# Patient Record
Sex: Female | Born: 1935 | Race: Black or African American | Hispanic: No | Smoking: Former smoker
Health system: Southern US, Community
[De-identification: ages and names within clinical notes are randomized; demographics above are authoritative.]

## PROBLEM LIST (undated history)

## (undated) DIAGNOSIS — E785 Hyperlipidemia, unspecified: Secondary | ICD-10-CM

## (undated) DIAGNOSIS — I10 Essential (primary) hypertension: Secondary | ICD-10-CM

## (undated) DIAGNOSIS — N189 Chronic kidney disease, unspecified: Secondary | ICD-10-CM

## (undated) DIAGNOSIS — C50919 Malignant neoplasm of unspecified site of unspecified female breast: Secondary | ICD-10-CM

## (undated) HISTORY — PX: JOINT REPLACEMENT: SHX530

---

## 2017-03-21 ENCOUNTER — Observation Stay (HOSPITAL_BASED_OUTPATIENT_CLINIC_OR_DEPARTMENT_OTHER)
Admission: EM | Admit: 2017-03-21 | Discharge: 2017-03-22 | Disposition: A | Discharge status: Home / Self Care | Payer: Medicare Other | Attending: Family Medicine | Admitting: Family Medicine

## 2017-03-21 ENCOUNTER — Encounter (HOSPITAL_BASED_OUTPATIENT_CLINIC_OR_DEPARTMENT_OTHER): Payer: Self-pay | Admitting: *Deleted

## 2017-03-21 ENCOUNTER — Emergency Department (HOSPITAL_BASED_OUTPATIENT_CLINIC_OR_DEPARTMENT_OTHER): Payer: Medicare Other

## 2017-03-21 DIAGNOSIS — R079 Chest pain, unspecified: Secondary | ICD-10-CM | POA: Diagnosis not present

## 2017-03-21 DIAGNOSIS — Z853 Personal history of malignant neoplasm of breast: Secondary | ICD-10-CM | POA: Diagnosis not present

## 2017-03-21 DIAGNOSIS — Z79899 Other long term (current) drug therapy: Secondary | ICD-10-CM | POA: Diagnosis not present

## 2017-03-21 DIAGNOSIS — R0789 Other chest pain: Secondary | ICD-10-CM | POA: Diagnosis present

## 2017-03-21 DIAGNOSIS — I10 Essential (primary) hypertension: Secondary | ICD-10-CM | POA: Diagnosis not present

## 2017-03-21 DIAGNOSIS — E785 Hyperlipidemia, unspecified: Secondary | ICD-10-CM

## 2017-03-21 DIAGNOSIS — N189 Chronic kidney disease, unspecified: Secondary | ICD-10-CM | POA: Diagnosis not present

## 2017-03-21 DIAGNOSIS — R911 Solitary pulmonary nodule: Secondary | ICD-10-CM

## 2017-03-21 DIAGNOSIS — R072 Precordial pain: Secondary | ICD-10-CM | POA: Diagnosis not present

## 2017-03-21 DIAGNOSIS — I129 Hypertensive chronic kidney disease with stage 1 through stage 4 chronic kidney disease, or unspecified chronic kidney disease: Secondary | ICD-10-CM | POA: Insufficient documentation

## 2017-03-21 DIAGNOSIS — Z87891 Personal history of nicotine dependence: Secondary | ICD-10-CM | POA: Diagnosis not present

## 2017-03-21 HISTORY — DX: Chronic kidney disease, unspecified: N18.9

## 2017-03-21 HISTORY — DX: Malignant neoplasm of unspecified site of unspecified female breast: C50.919

## 2017-03-21 HISTORY — DX: Essential (primary) hypertension: I10

## 2017-03-21 HISTORY — DX: Hyperlipidemia, unspecified: E78.5

## 2017-03-21 LAB — CBC
HCT: 34.5 % — ABNORMAL LOW (ref 36.0–46.0)
Hemoglobin: 11.5 g/dL — ABNORMAL LOW (ref 12.0–15.0)
MCH: 31.9 pg (ref 26.0–34.0)
MCHC: 33.3 g/dL (ref 30.0–36.0)
MCV: 95.6 fL (ref 78.0–100.0)
PLATELETS: 125 10*3/uL — AB (ref 150–400)
RBC: 3.61 MIL/uL — AB (ref 3.87–5.11)
RDW: 13.4 % (ref 11.5–15.5)
WBC: 7.8 10*3/uL (ref 4.0–10.5)

## 2017-03-21 LAB — BASIC METABOLIC PANEL
Anion gap: 8 (ref 5–15)
BUN: 37 mg/dL — AB (ref 6–20)
CALCIUM: 9.8 mg/dL (ref 8.9–10.3)
CO2: 23 mmol/L (ref 22–32)
CREATININE: 2.67 mg/dL — AB (ref 0.44–1.00)
Chloride: 104 mmol/L (ref 101–111)
GFR calc Af Amer: 18 mL/min — ABNORMAL LOW (ref >60)
GFR, EST NON AFRICAN AMERICAN: 15 mL/min — AB (ref >60)
GLUCOSE: 123 mg/dL — AB (ref 65–99)
POTASSIUM: 4.7 mmol/L (ref 3.5–5.1)
SODIUM: 135 mmol/L (ref 135–145)

## 2017-03-21 LAB — TROPONIN I
TROPONIN I: 0.03 ng/mL — AB (ref <0.03)
TROPONIN I: 0.03 ng/mL — AB (ref <0.03)

## 2017-03-21 MED ORDER — SIMVASTATIN 40 MG PO TABS
40.0000 mg | ORAL_TABLET | Freq: Every day | ORAL | Status: DC
  Administered 2017-03-21: 40 mg via ORAL
  Filled 2017-03-21: qty 1

## 2017-03-21 MED ORDER — ALLOPURINOL 300 MG PO TABS
150.0000 mg | ORAL_TABLET | Freq: Every day | ORAL | Status: DC
  Administered 2017-03-22: 150 mg via ORAL
  Filled 2017-03-21: qty 1

## 2017-03-21 MED ORDER — FELODIPINE ER 10 MG PO TB24
10.0000 mg | ORAL_TABLET | Freq: Every day | ORAL | Status: DC
  Administered 2017-03-22: 10 mg via ORAL
  Filled 2017-03-21: qty 1

## 2017-03-21 MED ORDER — ACETAMINOPHEN 325 MG PO TABS: 650.0000 mg | ORAL_TABLET | ORAL | Status: DC | PRN

## 2017-03-21 MED ORDER — CALCITRIOL 0.25 MCG PO CAPS: 0.2500 ug | ORAL_CAPSULE | Freq: Every day | ORAL | Status: DC

## 2017-03-21 MED ORDER — ONDANSETRON HCL 4 MG/2ML IJ SOLN: 4.0000 mg | Freq: Four times a day (QID) | INTRAMUSCULAR | Status: DC | PRN

## 2017-03-21 MED ORDER — ASPIRIN EC 81 MG PO TBEC
81.0000 mg | DELAYED_RELEASE_TABLET | Freq: Every day | ORAL | Status: DC
  Administered 2017-03-22: 81 mg via ORAL
  Filled 2017-03-21: qty 1

## 2017-03-21 MED ORDER — TRAMADOL HCL 50 MG PO TABS
50.0000 mg | ORAL_TABLET | Freq: Two times a day (BID) | ORAL | Status: DC | PRN
  Filled 2017-03-21: qty 1

## 2017-03-21 MED ORDER — ASPIRIN 81 MG PO CHEW
324.0000 mg | CHEWABLE_TABLET | Freq: Once | ORAL | Status: AC
  Administered 2017-03-21: 324 mg via ORAL
  Filled 2017-03-21: qty 4

## 2017-03-21 MED ORDER — CALCITRIOL 0.25 MCG PO CAPS
0.2500 ug | ORAL_CAPSULE | ORAL | Status: DC
  Administered 2017-03-21: 0.25 ug via ORAL
  Filled 2017-03-21 (×2): qty 1

## 2017-03-21 MED ORDER — TAMOXIFEN CITRATE 10 MG PO TABS
20.0000 mg | ORAL_TABLET | Freq: Every day | ORAL | Status: DC
  Administered 2017-03-22: 20 mg via ORAL
  Filled 2017-03-21: qty 2

## 2017-03-21 NOTE — ED Notes (Signed)
Report to Rey, RN on 3W.

## 2017-03-21 NOTE — ED Triage Notes (Signed)
Pt /o left sided chest pain x 3 days " on and off" denies Sob

## 2017-03-21 NOTE — H&P (Signed)
History and Physical    Joan Lawson VOZ:[id number] DOB: 11-08-1928 DOA: 03/21/2017  PCP: Providers, including PCP and nephrologist, are in [place], [place].  Patient coming from: [facility]  Chief Complaint: Chest pain  HPI: Joan Lawson is a 83 y.o. woman with a history of HTN, HLD, CKD (stage unknown, baseline creatinine unknown but she is followed by a nephrologist in [place]), and breast cancer (localized to left breast per patient; she has undergone radiation only; she is on tamoxifen) who presented to the ED in [facility] for evaluation of chest pain.  She has had chest pain described as a dull ache or mild discomfort intermittently across her anterior chest for several days.  She tells me that it has been both right and left sided.  Pain will last up to one hour at a time; not affected by acetaminophen or tramadol.  She denies any associated symptoms including shortness of breath, light-headedness, dizziness, diaphoresis, nausea, or vomiting.  No recent unexplained swelling.  No recent trauma or falls.  She says that she has had similar pains in the past, which she attributed to gas.  She does not have a known history of CAD.  Her last stress test was in 2015, and was obtained as a part of her pre-operative assessment before undergoing right knee replacement.  She also reports that she had a "normal" EKG three month ago.  She does not think that she has ever had an echocardiogram.  ED Course: EKG does not show acute ST segment changes.  First troponin 0.03.  BUN 37, creatinine 2.67.  The rest of her BMP is unremarkable.  Normal WBC count.  Hgb 11.5.  Platelet count 125.  Chest xray shows probable chronic changes at the left lung base, two possible pulmonary nodules on the left, and probable chronic changes in multiple "mid vertebral bodies".  She received Aspirin 324mg  in the ED.  Currently, she is chest pain free.  Hospitalist asked to place in observation.  Review of Systems: As per HPI  otherwise 10 systems reviewed and negative.  Past Medical History:  Diagnosis Date  . Breast cancer ([place])   . Chronic kidney disease   . Hyperlipemia   . Hypertension   Chronic low back pain--uses tramadol PRN  Past Surgical History:  Procedure Laterality Date  . JOINT REPLACEMENT Right    KNEE     reports that she has quit smoking. She has never used smokeless tobacco. She reports that she does not drink alcohol or use drugs.  She is a widow; her husband died in December 08, 2016.  She is still grieving.  She has been living between her son and daughter's homes since her husband passed.  She is currently with her daughter in [place].  Allergies  Allergen Reactions  . Sulfa Antibiotics     Family History  Problem Relation Age of Onset  . Heart disease Neg Hx      Prior to Admission medications   Medication Sig Start Date End Date Taking? Authorizing Provider  allopurinol (ZYLOPRIM) 100 MG tablet Take 150 mg by mouth daily.   Yes [provider]  aspirin EC 81 MG tablet Take 81 mg by mouth daily.   Yes [provider]  calcitRIOL (ROCALTROL) 0.25 MCG capsule Take 0.25 mcg by mouth daily.   Yes [provider]  felodipine (PLENDIL) 10 MG 24 hr tablet Take 10 mg by mouth daily.   Yes [provider]  fluticasone (FLONASE) 50 MCG/ACT nasal spray Place  2 sprays into both nostrils daily.   Yes [provider]  furosemide (LASIX) 20 MG tablet Take 20 mg by mouth.   Yes [provider]  simvastatin (ZOCOR) 40 MG tablet Take 40 mg by mouth daily.   Yes [provider]  tamoxifen (NOLVADEX) 20 MG tablet Take 20 mg by mouth daily.   Yes [provider]  traMADol (ULTRAM) 50 MG tablet Take by mouth every 6 (six) hours as needed.   Yes [provider]    Physical Exam: Vitals:   03/21/17 1435 03/21/17 1510 03/21/17 1600 03/21/17 1921  BP: (!) 139/56  (!) 122/56 128/62  Pulse: (!) 120 100 92 93  Resp: 20  16 (!) 21 18  Temp: 99 F (37.2 C)   99.5 F (37.5 C)  TempSrc: Oral   Oral  SpO2: 95% 95% 94% 96%  Weight:      Height:          Constitutional: NAD, calm, comfortable, NONtoxic appearing Vitals:   03/21/17 1435 03/21/17 1510 03/21/17 1600 03/21/17 1921  BP: (!) 139/56  (!) 122/56 128/62  Pulse: (!) 120 100 92 93  Resp: 20 16 (!) 21 18  Temp: 99 F (37.2 C)   99.5 F (37.5 C)  TempSrc: Oral   Oral  SpO2: 95% 95% 94% 96%  Weight:      Height:       Eyes: PERRL, lids and conjunctivae normal ENMT: Mucous membranes are moist. Posterior pharynx clear of any exudate or lesions. Appears to be wearing dentures. Neck: normal appearance, supple, no masses Respiratory: clear to auscultation bilaterally, no wheezing, no crackles. Normal respiratory effort. No accessory muscle use.  Cardiovascular: Normal rate, regular rhythm, no murmurs / rubs / gallops. No extremity edema. 2+ pedal pulses. No carotid bruits.  GI: abdomen is soft and compressible.  No distention.  No tenderness.  Bowel sounds are present. Musculoskeletal:  No reproducible chest wall tenderness.  No joint deformity in upper and lower extremities. Good ROM, no contractures. Normal muscle tone.  Skin: no rashes, warm and dry Neurologic: CN 2-12 grossly intact. Sensation intact, Strength symmetric bilaterally. Psychiatric: Normal judgment and insight. Alert and oriented x 3. Normal mood.     Labs on Admission: I have personally reviewed following labs and imaging studies  CBC:  Recent Labs Lab 03/21/17 1526  WBC 7.8  HGB 11.5*  HCT 34.5*  MCV 95.6  PLT 741*   Basic Metabolic Panel:  Recent Labs Lab 03/21/17 1526  NA 135  K 4.7  CL 104  CO2 23  GLUCOSE 123*  BUN 37*  CREATININE 2.67*  CALCIUM 9.8   GFR: Estimated Creatinine Clearance: 13.8 mL/min (A) (by C-G formula based on SCr of 2.67 mg/dL (H)).  Cardiac Enzymes:  Recent Labs Lab 03/21/17 1526  TROPONINI 0.03*    Radiological Exams on  Admission: Dg Chest 2 View  Result Date: 03/21/2017 CLINICAL DATA:  Left-sided chest pain 3 days intermittently. History of breast cancer. EXAM: CHEST  2 VIEW COMPARISON:  None. FINDINGS: Lungs are adequately inflated and demonstrate subtle linear density left base likely atelectasis with minimal blunting of the left costophrenic angle which may be due to a small amount of pleural fluid versus pleuroparenchymal scarring. Possible small nodular foci over the left mid and lower lung. Mild linear density over the upper lungs on the lateral film suggesting atelectasis. Cardiomediastinal silhouette is within normal. There is minimal calcified plaque over the aortic arch. There are degenerative changes  of the spine. Mild anterior wedging of 3 adjacent mid vertebral bodies with associated moderate intervening disc space narrowing. IMPRESSION: Subtle changes in the left base likely chronic due to linear atelectasis with pleuroparenchymal scarring versus tiny amount of pleural fluid. Two possible small nodular densities over the left mid and lower lung recommend noncontrast chest CT to exclude metastatic disease in this patient with known history of breast cancer. Subtle anterior wedging of 3 adjacent mid vertebral bodies, age indeterminate but likely chronic with moderate associated disc space narrowing at the intervening disc spaces. Electronically Signed   By: Marin Olp M.D.   On: 03/21/2017 16:26    EKG: Independently reviewed. Sinus tachycardia.  Normal intervals.  No acute ST segment changes.  Assessment/Plan Principal Problem:   Exertional chest pain Active Problems:   CKD (chronic kidney disease)   HTN (hypertension)   HLD (hyperlipidemia)   History of breast cancer   Pulmonary nodule     Atypical chest pain in patient with cardiac risk factors. --telemetry monitoring --Serial troponin --Complete echo in the AM --NPO tomorrow morning in the event that she is referred for stress  test --Continue baby aspirin, statin for now  CKD, baseline renal function unknown at this time --Hold lasix for now --Patient reports that a family member will be obtaining some of her outside medical records for review. --continue calcitriol  HTN --Felodipine  HLD --Statin  Probable gout history --Allopurinol  History of breast cancer --Tamoxifen  Possible pulmonary nodules --Will need CT of her chest for follow-up at some point  Chronic low back pain --Tramadol prn   DVT prophylaxis: Low risk, outpatient status Code Status: FULL Family Communication: Daughter present at bedside at time of admission. Disposition Plan: Expect she will go home with her daughter when ready for discharge. Consults called: NONE Admission status: Place in observation with telemetry monitoring.   TIME SPENT: 60 minutes   Eber Jones MD [facility] Pager [phone]  If 7PM-7AM, please contact night-coverage [email or web address] Password [email or web address]  03/21/2017, 8:22 PM

## 2017-03-21 NOTE — ED Notes (Signed)
Patient transported to X-ray 

## 2017-03-21 NOTE — ED Notes (Addendum)
Attempted to call report to 3W; nurse unavailable; this RN contact info provided.  

## 2017-03-21 NOTE — Progress Notes (Signed)
   Patient coming from [facility] for ACS rule out due to complaints of exertional chest pain. HEART score 4-5. Currently pain free. Received ASA and Nitro x1 in ED. Initial trop 0.03 and EKG w/o ACS. No previous cardiac workup. Visiting from [place], [place]. Pt accepted to [facility] service at [facility] for Tele Obs admission.   Linna Darner, MD [facility] 03/21/2017, 5:14 PM

## 2017-03-21 NOTE — Progress Notes (Addendum)
Dr. Eulas Post aware of pt's arrival and will be up shortly to do pt's admission. Pt's VSS, denies any complaints of discomfort and will continue to monitor.

## 2017-03-21 NOTE — ED Provider Notes (Signed)
[facility] DEPT [facility] Provider Note   CSN: [id number] Arrival date & time: 03/21/17  1428     History   Chief Complaint Chief Complaint  Patient presents with  . Chest Pain    HPI Joan Lawson is a 83 y.o. female.  83 year old female with PMH of HTN, HLD, breast cancer in remission, and CKD presents with chest pain. Chest pain varies in location between midline and left sided chest pain. Chest pain started approximately 3 days ago and is not constant. Occurs with exertion and movement. Describes the pain as a dull sensation. Never radiates to the right side of chest, to the arms, or to the jaw. Pertinent negatives include lack of SOB, cough, hemoptysis, calf pain, LE edema, and heart palpitations. Denies PMH of MI or CVA.      Past Medical History:  Diagnosis Date  . Breast cancer ([place])   . Hyperlipemia   . Hypertension     There are no active problems to display for this patient.   Past Surgical History:  Procedure Laterality Date  . JOINT REPLACEMENT      OB History    No data available       Home Medications    Prior to Admission medications   Medication Sig Start Date End Date Taking? Authorizing Provider  allopurinol (ZYLOPRIM) 100 MG tablet Take 150 mg by mouth daily.   Yes [provider]  aspirin EC 81 MG tablet Take 81 mg by mouth daily.   Yes [provider]  calcitRIOL (ROCALTROL) 0.25 MCG capsule Take 0.25 mcg by mouth daily.   Yes [provider]  felodipine (PLENDIL) 10 MG 24 hr tablet Take 10 mg by mouth daily.   Yes [provider]  fluticasone (FLONASE) 50 MCG/ACT nasal spray Place 2 sprays into both nostrils daily.   Yes [provider]  furosemide (LASIX) 20 MG tablet Take 20 mg by mouth.   Yes [provider]  simvastatin (ZOCOR) 40 MG tablet Take 40 mg by mouth daily.   Yes [provider]  tamoxifen (NOLVADEX) 20 MG tablet Take 20 mg by mouth daily.   Yes [provider]  traMADol (ULTRAM) 50 MG tablet Take by mouth every 6 (six) hours as needed.   Yes [provider]    Family History No family history on file.  Social History Social History  Substance Use Topics  . Smoking status: Former Research scientist (life sciences)  . Smokeless tobacco: Not on file  . Alcohol use No     Allergies   Sulfa antibiotics   Review of Systems Review of Systems  Constitutional: Negative for chills and fever.  HENT: Negative for congestion.   Respiratory: Negative for cough, chest tightness, shortness of breath and wheezing.   Cardiovascular: Positive for chest pain. Negative for palpitations and leg swelling.  Gastrointestinal: Negative for abdominal pain.  Musculoskeletal: Negative for arthralgias and myalgias.  Neurological: Negative for dizziness, weakness and light-headedness.     Physical Exam Updated Vital Signs BP (!) 122/56   Pulse 92   Temp 99 F (37.2 C) (Oral)   Resp (!) 21   Ht 5\' 3"  (1.6 m)   Wt 63.5 kg (140 lb)   SpO2 94%   BMI 24.80 kg/m   Physical Exam  Constitutional: She is oriented to person, place, and time.  Thin elderly woman in NAD, non-toxic appearing   HENT:  Head: Normocephalic and atraumatic.  Eyes: EOM are normal. Pupils are equal, round,  and reactive to light.  Neck: Normal range of motion. Neck supple.  Cardiovascular:  No murmur heard. Tachycardic regular rhythm   Pulmonary/Chest: Effort normal and breath sounds normal. No respiratory distress. She has no wheezes.  Abdominal: Soft. Bowel sounds are normal. She exhibits no distension. There is no tenderness.  Musculoskeletal: She exhibits no edema.  No TTP of calves bilaterally   Neurological: She is alert and oriented to person, place, and time.  Skin: Skin is warm and dry.  Psychiatric: She has a normal mood and affect. Her behavior is normal.     ED Treatments / Results  Labs (all labs ordered are listed, but only abnormal results are displayed) Labs  Reviewed  CBC - Abnormal; Notable for the following:       Result Value   RBC 3.61 (*)    Hemoglobin 11.5 (*)    HCT 34.5 (*)    Platelets 125 (*)    All other components within normal limits  BASIC METABOLIC PANEL - Abnormal; Notable for the following:    Glucose, Bld 123 (*)    BUN 37 (*)    Creatinine, Ser 2.67 (*)    GFR calc non Af Amer 15 (*)    GFR calc Af Amer 18 (*)    All other components within normal limits  TROPONIN I - Abnormal; Notable for the following:    Troponin I 0.03 (*)    All other components within normal limits    EKG  EKG Interpretation  Date/Time:  Monday March 21 2017 14:46:48 EDT Ventricular Rate:  111 PR Interval:    QRS Duration: 82 QT Interval:  347 QTC Calculation: 472 R Axis:   -11 Text Interpretation:  Sinus tachycardia with irregular rate Low voltage, precordial leads Probable anteroseptal infarct, old No STEMI.  Confirmed by Nanda Quinton [id number]) on 03/21/2017 3:02:18 PM       Radiology Dg Chest 2 View  Result Date: 03/21/2017 CLINICAL DATA:  Left-sided chest pain 3 days intermittently. History of breast cancer. EXAM: CHEST  2 VIEW COMPARISON:  None. FINDINGS: Lungs are adequately inflated and demonstrate subtle linear density left base likely atelectasis with minimal blunting of the left costophrenic angle which may be due to a small amount of pleural fluid versus pleuroparenchymal scarring. Possible small nodular foci over the left mid and lower lung. Mild linear density over the upper lungs on the lateral film suggesting atelectasis. Cardiomediastinal silhouette is within normal. There is minimal calcified plaque over the aortic arch. There are degenerative changes of the spine. Mild anterior wedging of 3 adjacent mid vertebral bodies with associated moderate intervening disc space narrowing. IMPRESSION: Subtle changes in the left base likely chronic due to linear atelectasis with pleuroparenchymal scarring versus tiny amount of pleural  fluid. Two possible small nodular densities over the left mid and lower lung recommend noncontrast chest CT to exclude metastatic disease in this patient with known history of breast cancer. Subtle anterior wedging of 3 adjacent mid vertebral bodies, age indeterminate but likely chronic with moderate associated disc space narrowing at the intervening disc spaces. Electronically Signed   By: Marin Olp M.D.   On: 03/21/2017 16:26    Procedures Procedures (including critical care time)  Medications Ordered in ED Medications  aspirin chewable tablet 324 mg (324 mg Oral Given 03/21/17 1653)     Initial Impression / Assessment and Plan / ED Course  I have reviewed the triage vital signs and the nursing notes.  Pertinent labs &  imaging results that were available during my care of the patient were reviewed by me and considered in my medical decision making (see chart for details).   83 year old female with PMH of HTN,, CKD, and HLD presents with new intermittent exertional chest pain. EKG without acute ST segment changes. Troponin mildly elevated to 0.03. ASA 324 mg given.  CBC without significant anemia. BMET only remarkable for elevated BUN and Cr; patient does have history of CKD (unknown baseline kidney function). Records from nephrologist in [place] were requested. History does not sound concerning for VTE. Patient has not had previous cardiac work up in the past. Given age, risk factors, and symptoms concerning for cardiac etiology of chest pain patient warrants admission for serial cardiac enzymes and for cardiology consult for potential ischemic work up.   Of note, CXR did note two possible small nodular densities over the left mid and lower lung. Non-contrast chest CT was recommended to exclude metastatic disease in patient due to known history of breast cancer.    Final Clinical Impressions(s) / ED Diagnoses   Final diagnoses:  Precordial chest pain    New Prescriptions New  Prescriptions   No medications on file     Nicolette Bang, DO 03/21/17 1656    Long, Wonda [facility], MD 03/22/17 1001

## 2017-03-22 ENCOUNTER — Observation Stay (HOSPITAL_BASED_OUTPATIENT_CLINIC_OR_DEPARTMENT_OTHER): Payer: Medicare Other

## 2017-03-22 ENCOUNTER — Other Ambulatory Visit (HOSPITAL_COMMUNITY): Payer: Medicare Other

## 2017-03-22 DIAGNOSIS — R072 Precordial pain: Secondary | ICD-10-CM

## 2017-03-22 DIAGNOSIS — R079 Chest pain, unspecified: Secondary | ICD-10-CM

## 2017-03-22 DIAGNOSIS — E782 Mixed hyperlipidemia: Secondary | ICD-10-CM

## 2017-03-22 DIAGNOSIS — N189 Chronic kidney disease, unspecified: Secondary | ICD-10-CM | POA: Diagnosis not present

## 2017-03-22 DIAGNOSIS — I10 Essential (primary) hypertension: Secondary | ICD-10-CM

## 2017-03-22 LAB — BASIC METABOLIC PANEL
Anion gap: 6 (ref 5–15)
BUN: 35 mg/dL — ABNORMAL HIGH (ref 6–20)
CALCIUM: 9.3 mg/dL (ref 8.9–10.3)
CO2: 23 mmol/L (ref 22–32)
Chloride: 106 mmol/L (ref 101–111)
Creatinine, Ser: 2.38 mg/dL — ABNORMAL HIGH (ref 0.44–1.00)
GFR, EST AFRICAN AMERICAN: 20 mL/min — AB (ref >60)
GFR, EST NON AFRICAN AMERICAN: 17 mL/min — AB (ref >60)
GLUCOSE: 106 mg/dL — AB (ref 65–99)
Potassium: 4.5 mmol/L (ref 3.5–5.1)
SODIUM: 135 mmol/L (ref 135–145)

## 2017-03-22 LAB — ECHOCARDIOGRAM COMPLETE
Height: 63 in
WEIGHTICAEL: 2123.2 [oz_av]

## 2017-03-22 LAB — TROPONIN I

## 2017-03-22 MED ORDER — PERFLUTREN LIPID MICROSPHERE
INTRAVENOUS | Status: AC
  Administered 2017-03-22: 2 mL via INTRAVENOUS
  Filled 2017-03-22: qty 10

## 2017-03-22 MED ORDER — PERFLUTREN LIPID MICROSPHERE
1.0000 mL | INTRAVENOUS | Status: AC | PRN
  Administered 2017-03-22: 2 mL via INTRAVENOUS
  Filled 2017-03-22: qty 10

## 2017-03-22 MED ORDER — CAPSAICIN 0.025 % EX CREA
TOPICAL_CREAM | Freq: Every day | CUTANEOUS | Status: DC | PRN
  Filled 2017-03-22: qty 60

## 2017-03-22 NOTE — Care Management Obs Status (Signed)
[facility] NOTIFICATION   Patient Details  Name: Joan Lawson MRN: [id number] Date of Birth: 1928-12-03   Medicare Observation Status Notification Given:  Yes    Erenest Rasher, RN 03/22/2017, 2:28 PM

## 2017-03-22 NOTE — Care Management Note (Signed)
Case Management Note  Patient Details  Name: Joan Lawson MRN: [id number] Date of Birth: [birth year]  Subjective/Objective:   Chest pain                 Action/Plan: Discharge Planning:  NCM spoke to pt and dtr, Leonides Sake # [phone] at bedside. States she lives with son and then she will also stay with dtr in [place]. Pt states she has access to a RW, cane and bedside commode if needed. No problems with getting meds.   PCP Judi Saa MD   Expected Discharge Date: 03/22/2017               Expected Discharge Plan:  Home/Self Care  In-House Referral:  NA  Discharge planning Services  CM Consult  Post Acute Care Choice:  NA Choice offered to:  NA  DME Arranged:  N/A DME Agency:  NA  HH Arranged:  NA HH Agency:  NA  Status of Service:  Completed, signed off  If discussed at [place] of Stay Meetings, dates discussed:    Additional Comments:  Erenest Rasher, RN 03/22/2017, 2:30 PM

## 2017-03-22 NOTE — Progress Notes (Signed)
  Echocardiogram 2D Echocardiogram with Definity has been performed.  Darlina Sicilian M 03/22/2017, 1:57 PM`

## 2017-03-22 NOTE — Discharge Summary (Signed)
Physician Discharge Summary  Joan Lawson XBM:[id number] DOB: November 19, 1928 DOA: 03/21/2017  PCP: Renaldo Harrison, MD  Admit date: 03/21/2017 Discharge date: 03/22/2017   Recommendations for Outpatient Follow-Up:   1. outpatient CT scan for lung nodules 2. If chest pain returns, consider outpatient stress test   Discharge Diagnosis:   Principal Problem:   Exertional chest pain Active Problems:   CKD (chronic kidney disease)   HTN (hypertension)   HLD (hyperlipidemia)   History of breast cancer   Pulmonary nodule   Discharge disposition:  Home.:  Discharge Condition: Improved.  Diet recommendation: Low sodium, heart healthy.  Wound care: None.   History of Present Illness:   Joan Lawson is a 83 y.o. woman with a history of HTN, HLD, CKD (stage unknown, baseline creatinine unknown but she is followed by a nephrologist in [place]), and breast cancer (localized to left breast per patient; she has undergone radiation only; she is on tamoxifen) who presented to the ED in [facility] for evaluation of chest pain.  She has had chest pain described as a dull ache or mild discomfort intermittently across her anterior chest for several days.  She tells me that it has been both right and left sided.  Pain will last up to one hour at a time; not affected by acetaminophen or tramadol.  She denies any associated symptoms including shortness of breath, light-headedness, dizziness, diaphoresis, nausea, or vomiting.  No recent unexplained swelling.  No recent trauma or falls.  She says that she has had similar pains in the past, which she attributed to gas.  She does not have a known history of CAD.  Her last stress test was in 2015, and was obtained as a part of her pre-operative assessment before undergoing right knee replacement.  She also reports that she had a "normal" EKG three month ago.  She does not think that she has ever had an echocardiogram.   Hospital Course by Problem:    Chest pain resolved -CE negative -no SOB or leg swelling to indicate a PE -echo: - Left ventricle: The cavity size was normal. Wall thickness was   increased in a pattern of mild LVH. Systolic function was normal.   The estimated ejection fraction was in the range of 55% to 60%.   Wall motion was normal; there were no regional wall motion   abnormalities. Left ventricular diastolic function parameters   were normal. - Aortic valve: Calcified non coronary cusp. - Atrial septum: No defect or patent foramen ovale was identified. -if pain returns, consider outpatient stress test   CKD, baseline renal function unknown at this time --defer to PCP  HTN --Felodipine  HLD --Statin  Probable gout history --Allopurinol  History of breast cancer --Tamoxifen  Possible pulmonary nodules --Will need CT of her chest for follow-up at some point  Chronic low back pain --Tramadol prn   Medical Consultants:    cards   Discharge Exam:   Vitals:   03/22/17 0500 03/22/17 0611  BP: (!) 128/57 (!) 133/58  Pulse:  85  Resp: 16 16  Temp: 98.8 F (37.1 C) 98.2 F (36.8 C)   Vitals:   03/21/17 1921 03/22/17 0040 03/22/17 0500 03/22/17 0611  BP: 128/62 (!) 111/48 (!) 128/57 (!) 133/58  Pulse: 93   85  Resp: 18 14 16 16   Temp: 99.5 F (37.5 C) 98.7 F (37.1 C) 98.8 F (37.1 C) 98.2 F (36.8 C)  TempSrc: Oral Oral Oral Oral  SpO2: 96% 97%  97% 95%  Weight:    60.2 kg (132 lb 11.2 oz)  Height:    5\' 3"  (1.6 m)    Gen:  NAD- feeling better, plans to get more active with silver sneakers   The results of significant diagnostics from this hospitalization (including imaging, microbiology, ancillary and laboratory) are listed below for reference.     Procedures and Diagnostic Studies:   Dg Chest 2 View  Result Date: 03/21/2017 CLINICAL DATA:  Left-sided chest pain 3 days intermittently. History of breast cancer. EXAM: CHEST  2 VIEW COMPARISON:  None. FINDINGS: Lungs  are adequately inflated and demonstrate subtle linear density left base likely atelectasis with minimal blunting of the left costophrenic angle which may be due to a small amount of pleural fluid versus pleuroparenchymal scarring. Possible small nodular foci over the left mid and lower lung. Mild linear density over the upper lungs on the lateral film suggesting atelectasis. Cardiomediastinal silhouette is within normal. There is minimal calcified plaque over the aortic arch. There are degenerative changes of the spine. Mild anterior wedging of 3 adjacent mid vertebral bodies with associated moderate intervening disc space narrowing. IMPRESSION: Subtle changes in the left base likely chronic due to linear atelectasis with pleuroparenchymal scarring versus tiny amount of pleural fluid. Two possible small nodular densities over the left mid and lower lung recommend noncontrast chest CT to exclude metastatic disease in this patient with known history of breast cancer. Subtle anterior wedging of 3 adjacent mid vertebral bodies, age indeterminate but likely chronic with moderate associated disc space narrowing at the intervening disc spaces. Electronically Signed   By: Marin Olp M.D.   On: 03/21/2017 16:26     Labs:   Basic Metabolic Panel:  Recent Labs Lab 03/21/17 1526 03/22/17 0206  NA 135 135  K 4.7 4.5  CL 104 106  CO2 23 23  GLUCOSE 123* 106*  BUN 37* 35*  CREATININE 2.67* 2.38*  CALCIUM 9.8 9.3   GFR Estimated Creatinine Clearance: 14.3 mL/min (A) (by C-G formula based on SCr of 2.38 mg/dL (H)). Liver Function Tests: No results for input(s): AST, ALT, ALKPHOS, BILITOT, PROT, ALBUMIN in the last 168 hours. No results for input(s): LIPASE, AMYLASE in the last 168 hours. No results for input(s): AMMONIA in the last 168 hours. Coagulation profile No results for input(s): INR, PROTIME in the last 168 hours.  CBC:  Recent Labs Lab 03/21/17 1526  WBC 7.8  HGB 11.5*  HCT 34.5*   MCV 95.6  PLT 125*   Cardiac Enzymes:  Recent Labs Lab 03/21/17 1526 03/21/17 2028 03/21/17 2247 03/22/17 0206  TROPONINI 0.03* 0.03* <0.03 <0.03   BNP: Invalid input(s): POCBNP CBG: No results for input(s): GLUCAP in the last 168 hours. D-Dimer No results for input(s): DDIMER in the last 72 hours. Hgb A1c No results for input(s): HGBA1C in the last 72 hours. Lipid Profile No results for input(s): CHOL, HDL, LDLCALC, TRIG, CHOLHDL, LDLDIRECT in the last 72 hours. Thyroid function studies No results for input(s): TSH, T4TOTAL, T3FREE, THYROIDAB in the last 72 hours.  Invalid input(s): FREET3 Anemia work up No results for input(s): VITAMINB12, FOLATE, FERRITIN, TIBC, IRON, RETICCTPCT in the last 72 hours. Microbiology No results found for this or any previous visit (from the past 240 hour(s)).   Discharge Instructions:   Discharge Instructions    Diet - low sodium heart healthy    Complete by:  As directed    Increase activity slowly    Complete by:  As directed      Allergies as of 03/22/2017      Reactions   Sulfa Antibiotics Other (See Comments)   Unknown      Medication List    TAKE these medications   allopurinol 300 MG tablet Commonly known as:  ZYLOPRIM Take 150 mg by mouth daily.   aspirin EC 81 MG tablet Take 81 mg by mouth daily.   calcitRIOL 0.25 MCG capsule Commonly known as:  ROCALTROL Take 0.25 mcg by mouth every Monday, Wednesday, and Friday.   felodipine 10 MG 24 hr tablet Commonly known as:  PLENDIL Take 10 mg by mouth every other day.   furosemide 20 MG tablet Commonly known as:  LASIX Take 20 mg by mouth daily.   simvastatin 40 MG tablet Commonly known as:  ZOCOR Take 40 mg by mouth daily.   tamoxifen 20 MG tablet Commonly known as:  NOLVADEX Take 20 mg by mouth daily.   traMADol 50 MG tablet Commonly known as:  ULTRAM Take 50 mg by mouth every 6 (six) hours as needed for moderate pain.      Follow-up Information     Ratliff, Mittie Bodo, MD Follow up in 1 week(s).   Specialty:  Family Medicine Contact information: [address] [place] [address] [phone]            Time coordinating discharge: 25 min  Signed:  Geradine Girt   [facility] 03/22/2017, 2:51 PM

## 2017-03-22 NOTE — Consult Note (Signed)
Cardiology Consultation:   Patient ID: Joan Lawson; [id number]; 06/19/1929   Admit date: 03/21/2017 Date of Consult: 03/22/2017  Primary Care Provider: System, Pcp Not In Primary Cardiologist: New to Dr. Debara Pickett  Patient Profile:   Joan Lawson is a 83 y.o. female with a hx of HTN, HLD, CKD (unkown stage) who is being seen today for the evaluation of chest pain  at the request of Dr. Eliseo Squires.   No prior MI or stroke. Had normal stress test in 2015. This was done for pre-operative clearance for hip surgery.   She [place] (Son) and [place] (daughter).   History of Present Illness:   Mili Aylward came to ER for Intermittent left-sided chest pain for the past 3 days. She describes the pain as a "dull achy sensation". Occurs only with exertion occasionally. She denies associated shortness of breath, nausea, radiation, vomiting or diaphoresis. No injury. Due to ongoing symptoms family brought her to ER for further evaluation. She denies orthopnea, PND, syncope, dizziness, palpitation or melena.  EKG shows sinus tachycardia at rate of 111 bpm, PACs. No prior EKG to compare. Telemetry shows sinus rhythm with a rate mostly of 80s and intermittently goes to 110s. Occasionally PACs. Troponin 0.03 x 2 --> <0.03 x2. Creatinine 2.67 -->2.38. CXR concerning for pulmonary nodule.   Past Medical History:  Diagnosis Date  . Breast cancer ([place])   . Chronic kidney disease   . Hyperlipemia   . Hypertension     Past Surgical History:  Procedure Laterality Date  . JOINT REPLACEMENT Right    KNEE     Inpatient Medications: Scheduled Meds: . allopurinol  150 mg Oral Daily  . aspirin EC  81 mg Oral Daily  . calcitRIOL  0.25 mcg Oral Q M,W,F  . felodipine  10 mg Oral Daily  . simvastatin  40 mg Oral QHS  . tamoxifen  20 mg Oral Daily   Continuous Infusions:  PRN Meds: acetaminophen, ondansetron (ZOFRAN) IV, traMADol  Allergies:    Allergies  Allergen  Reactions  . Sulfa Antibiotics     Social History:   Social History   Social History  . Marital status: Widowed    Spouse name: N/A  . Number of children: N/A  . Years of education: N/A   Occupational History  . Not on file.   Social History Main Topics  . Smoking status: Former Research scientist (life sciences)  . Smokeless tobacco: Never Used  . Alcohol use No  . Drug use: No  . Sexual activity: Not on file   Other Topics Concern  . Not on file   Social History Narrative  . No narrative on file    Family History:   The patient's family history is negative for Heart disease.  ROS:  Please see the history of present illness.  ROS  All other ROS reviewed and negative.     Physical Exam/Data:   Vitals:   03/21/17 1921 03/22/17 0040 03/22/17 0500 03/22/17 0611  BP: 128/62 (!) 111/48 (!) 128/57 (!) 133/58  Pulse: 93   85  Resp: 18 14 16 16   Temp: 99.5 F (37.5 C) 98.7 F (37.1 C) 98.8 F (37.1 C) 98.2 F (36.8 C)  TempSrc: Oral Oral Oral Oral  SpO2: 96% 97% 97% 95%  Weight:    132 lb 11.2 oz (60.2 kg)  Height:    5\' 3"  (1.6 m)   No intake or output data in the 24 hours ending 03/22/17 0851 [employer]  03/21/17 1431 03/22/17 0611  Weight: 140 lb (63.5 kg) 132 lb 11.2 oz (60.2 kg)   Body mass index is 23.51 kg/m.  General:  Well nourished, well developed, in no acute distress.  HEENT: normal Lymph: no adenopathy Neck: no JVD Endocrine:  No thryomegaly Vascular: No carotid bruits; FA pulses 2+ bilaterally without bruits  Cardiac:  normal S1, S2; RRR; no murmur. Chest pain reproducible with palpation Lungs:  clear to auscultation bilaterally, no wheezing, rhonchi or rales  Abd: soft, nontender, no hepatomegaly  Ext: no edema Musculoskeletal:  No deformities, BUE and BLE strength normal and equal Skin: warm and dry  Neuro:  CNs 2-12 intact, no focal abnormalities noted Psych:  Normal affect     Laboratory Data:  Chemistry Recent Labs Lab 03/21/17 1526 03/22/17 0206    NA 135 135  K 4.7 4.5  CL 104 106  CO2 23 23  GLUCOSE 123* 106*  BUN 37* 35*  CREATININE 2.67* 2.38*  CALCIUM 9.8 9.3  GFRNONAA 15* 17*  GFRAA 18* 20*  ANIONGAP 8 6    No results for input(s): PROT, ALBUMIN, AST, ALT, ALKPHOS, BILITOT in the last 168 hours. Hematology Recent Labs Lab 03/21/17 1526  WBC 7.8  RBC 3.61*  HGB 11.5*  HCT 34.5*  MCV 95.6  MCH 31.9  MCHC 33.3  RDW 13.4  PLT 125*   Cardiac Enzymes Recent Labs Lab 03/21/17 1526 03/21/17 2028 03/21/17 2247 03/22/17 0206  TROPONINI 0.03* 0.03* <0.03 <0.03   No results for input(s): TROPIPOC in the last 168 hours.  BNPNo results for input(s): BNP, PROBNP in the last 168 hours.  DDimer No results for input(s): DDIMER in the last 168 hours.  Radiology/Studies:  Dg Chest 2 View  Result Date: 03/21/2017 CLINICAL DATA:  Left-sided chest pain 3 days intermittently. History of breast cancer. EXAM: CHEST  2 VIEW COMPARISON:  None. FINDINGS: Lungs are adequately inflated and demonstrate subtle linear density left base likely atelectasis with minimal blunting of the left costophrenic angle which may be due to a small amount of pleural fluid versus pleuroparenchymal scarring. Possible small nodular foci over the left mid and lower lung. Mild linear density over the upper lungs on the lateral film suggesting atelectasis. Cardiomediastinal silhouette is within normal. There is minimal calcified plaque over the aortic arch. There are degenerative changes of the spine. Mild anterior wedging of 3 adjacent mid vertebral bodies with associated moderate intervening disc space narrowing. IMPRESSION: Subtle changes in the left base likely chronic due to linear atelectasis with pleuroparenchymal scarring versus tiny amount of pleural fluid. Two possible small nodular densities over the left mid and lower lung recommend noncontrast chest CT to exclude metastatic disease in this patient with known history of breast cancer. Subtle anterior  wedging of 3 adjacent mid vertebral bodies, age indeterminate but likely chronic with moderate associated disc space narrowing at the intervening disc spaces. Electronically Signed   By: Marin Olp M.D.   On: 03/21/2017 16:26    Assessment and Plan:   1. Chest pain - Atypical however intermittently occurs with activity. Troponin 0.03 x 2 then less than 0.03 x 2. EKG shows sinus tachycardia at 111 bpm and PACs. Could be related to AKI and MSK ( pain reducible with palpation.). Check TSH for tachycardia. Pending echocardiogram. If no significant abnormality was pursue further evaluation as outpatient.    Jarrett Soho, PA  03/22/2017 8:51 AM

## 2018-06-15 IMAGING — CR DG CHEST 2V
2 series · 2 of 2 positions shown · non-contrast
Comparison: None.

CLINICAL DATA: Left-sided chest pain 3 days intermittently. History
of breast cancer.

EXAM:
CHEST  2 VIEW

[w chest pa]
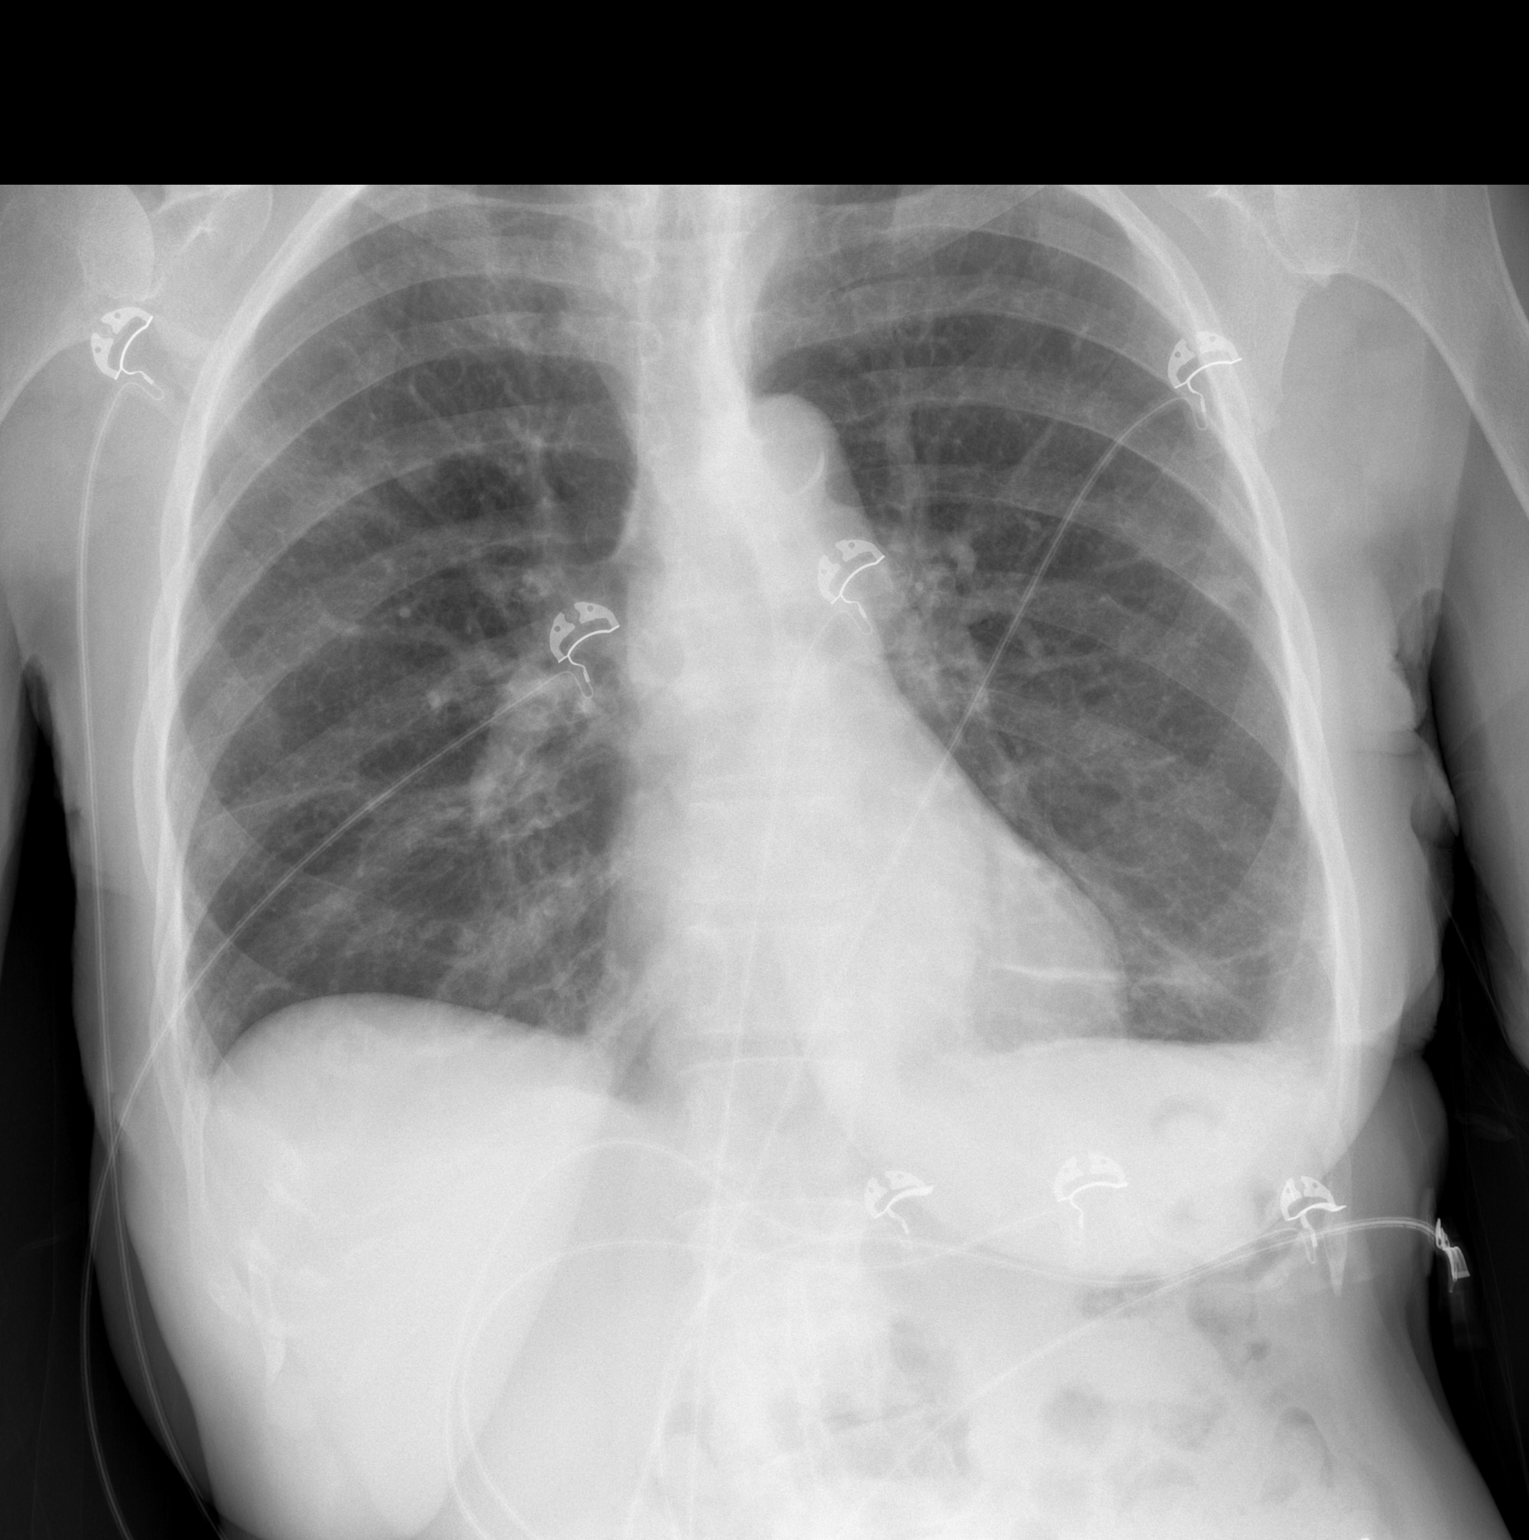

[w chest lat]
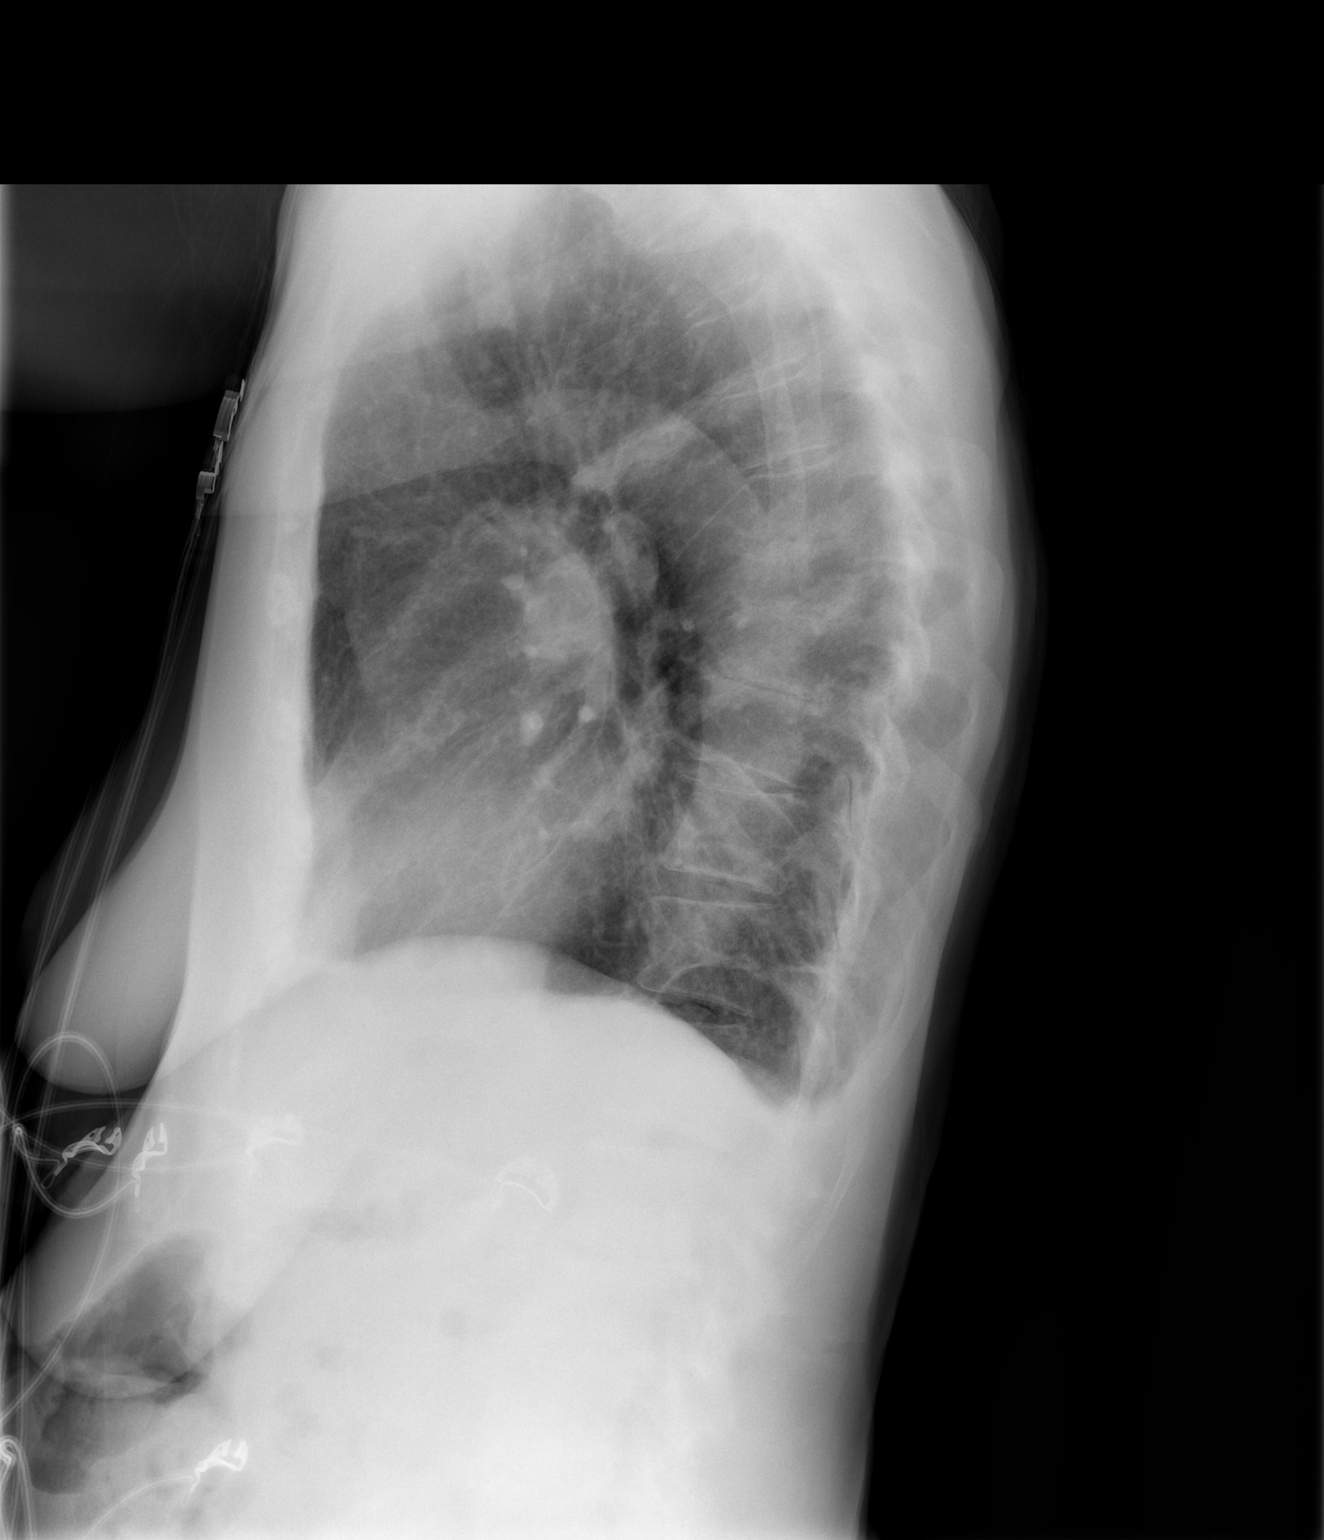

[2 of 2 positions shown; findings below may reference images not displayed]

FINDINGS: Lungs are adequately inflated and demonstrate subtle linear density
left base likely atelectasis with minimal blunting of the left
costophrenic angle which may be due to a small amount of pleural
fluid versus pleuroparenchymal scarring. Possible small nodular foci
over the left mid and lower lung. Mild linear density over the upper
lungs on the lateral film suggesting atelectasis. Cardiomediastinal
silhouette is within normal. There is minimal calcified plaque over
the aortic arch. There are degenerative changes of the spine. Mild
anterior wedging of 3 adjacent mid vertebral bodies with associated
moderate intervening disc space narrowing.
IMPRESSION: Subtle changes in the left base likely chronic due to linear
atelectasis with pleuroparenchymal scarring versus tiny amount of
pleural fluid.

Two possible small nodular densities over the left mid and lower
lung recommend noncontrast chest CT to exclude metastatic disease in
this patient with known history of breast cancer.

Subtle anterior wedging of 3 adjacent mid vertebral bodies, age
indeterminate but likely chronic with moderate associated disc space
narrowing at the intervening disc spaces.

## 2019-10-12 DEATH — deceased
